# Patient Record
Sex: Female | Born: 1990 | Race: Black or African American | Hispanic: No | Marital: Single | Smoking: Never smoker
Health system: Southern US, Community
[De-identification: ages and names within clinical notes are randomized; demographics above are authoritative.]

---
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
## 2009-08-20 ENCOUNTER — Emergency Department (HOSPITAL_COMMUNITY): Admission: EM | Admit: 2009-08-20 | Discharge: 2009-08-21 | Payer: Self-pay | Admitting: Emergency Medicine

## 2009-08-24 ENCOUNTER — Emergency Department (HOSPITAL_COMMUNITY): Admission: EM | Admit: 2009-08-24 | Discharge: 2009-08-24 | Payer: Self-pay | Admitting: Emergency Medicine

## 2010-01-15 ENCOUNTER — Emergency Department (HOSPITAL_COMMUNITY): Admission: EM | Admit: 2010-01-15 | Discharge: 2010-01-15 | Payer: Self-pay | Admitting: Emergency Medicine
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
## 2014-10-26 ENCOUNTER — Emergency Department (HOSPITAL_COMMUNITY)
Admission: EM | Admit: 2014-10-26 | Discharge: 2014-10-26 | Disposition: A | Discharge status: Home / Self Care | Payer: Self-pay | Attending: Emergency Medicine | Admitting: Emergency Medicine

## 2014-10-26 ENCOUNTER — Encounter (HOSPITAL_COMMUNITY): Payer: Self-pay | Admitting: *Deleted

## 2014-10-26 DIAGNOSIS — K029 Dental caries, unspecified: Secondary | ICD-10-CM | POA: Insufficient documentation

## 2014-10-26 DIAGNOSIS — Z79899 Other long term (current) drug therapy: Secondary | ICD-10-CM | POA: Insufficient documentation

## 2014-10-26 DIAGNOSIS — K0889 Other specified disorders of teeth and supporting structures: Secondary | ICD-10-CM

## 2014-10-26 DIAGNOSIS — K088 Other specified disorders of teeth and supporting structures: Secondary | ICD-10-CM | POA: Insufficient documentation

## 2014-10-26 MED ORDER — CLINDAMYCIN HCL 150 MG PO CAPS
300.0000 mg | ORAL_CAPSULE | Freq: Once | ORAL | Status: AC
  Administered 2014-10-26: 300 mg via ORAL
  Filled 2014-10-26: qty 2

## 2014-10-26 MED ORDER — NAPROXEN 500 MG PO TABS: 500.0000 mg | ORAL_TABLET | Freq: Two times a day (BID) | ORAL | Status: AC

## 2014-10-26 MED ORDER — CLINDAMYCIN HCL 150 MG PO CAPS: 150.0000 mg | ORAL_CAPSULE | Freq: Four times a day (QID) | ORAL | Status: AC

## 2014-10-26 NOTE — Discharge Instructions (Signed)
Dental Pain °Toothache is pain in or around a tooth. It may get worse with chewing or with cold or heat.  °HOME CARE °· Your dentist may use a numbing medicine during treatment. If so, you may need to avoid eating until the medicine wears off. Ask your dentist about this. °· Only take medicine as told by your dentist or doctor. °· Avoid chewing food near the painful tooth until after all treatment is done. Ask your dentist about this. °GET HELP RIGHT AWAY IF:  °· The problem gets worse or new problems appear. °· You have a fever. °· There is redness and puffiness (swelling) of the face, jaw, or neck. °· You cannot open your mouth. °· There is pain in the jaw. °· There is very bad pain that is not helped by medicine. °MAKE SURE YOU:  °· Understand these instructions. °· Will watch your condition. °· Will get help right away if you are not doing well or get worse. °Document Released: 12/16/2007 Document Revised: 09/21/2011 Document Reviewed: 12/16/2007 °ExitCare® Patient Information ©2015 [employer]. This information is not intended to replace advice given to you by your health care provider. Make sure you discuss any questions you have with your health care provider. ° ° ° °Emergency Department Resource Guide °1) Find a Doctor and Pay Out of Pocket °Although you won't have to find out who is covered by your insurance plan, it is a good idea to ask around and get recommendations. You will then need to call the office and see if the doctor you have chosen will accept you as a new patient and what types of options they offer for patients who are self-pay. Some doctors offer discounts or will set up payment plans for their patients who do not have insurance, but you will need to ask so you aren't surprised when you get to your appointment. ° °2) Contact Your Local Health Department °Not all health departments have doctors that can see patients for sick visits, but many do, so it is worth a call to see if yours does.  If you don't know where your local health department is, you can check in your phone book. The CDC also has a tool to help you locate your state's health department, and many state websites also have listings of all of their local health departments. ° °3) Find a Walk-in Clinic °If your illness is not likely to be very severe or complicated, you may want to try a walk in clinic. These are popping up all over the country in pharmacies, drugstores, and shopping centers. They're usually staffed by nurse practitioners or physician assistants that have been trained to treat common illnesses and complaints. They're usually fairly quick and inexpensive. However, if you have serious medical issues or chronic medical problems, these are probably not your best option. ° °No Primary Care Doctor: °- Call [facility] at  832-8000 - they can help you locate a primary care doctor that  accepts your insurance, provides certain services, etc. °- Physician Referral Service- 1-800-533-3463 ° °Chronic Pain Problems: °Organization         Address  Phone   Notes  °[facility]  (336) 297-2271 Patients need to be referred by their primary care doctor.  ° °Medication Assistance: °Organization         Address  Phone   Notes  °[place] [facility] [address] °[place], [place] [address] (336) 641-8030 --Must be a resident   of [place] °-- Must have NO insurance coverage whatsoever (no Medicaid/ Medicare, etc.) °-- The pt. MUST have a primary care doctor that directs their care regularly and follows them in the community °  °[facility]  (866) 331-1348   °[employer]  (888) 892-1162   ° °Agencies that provide inexpensive medical care: °Organization         Address  Phone   Notes  °[facility]  (336) 832-8035   °[facility]    (336) 832-7272   °[facility] [address] °[place], [place] [address] (336) 832-4777   °[facility] [address], °[place] (336) 271-4999   °[facility]    (336) 373-0678   °[facility]    (336) 272-1050   °[facility] ° [address], [place] Phone:  (336) 832-4444, Fax:  (336) 832-4440 Hours of Operation:  9 am - 6 pm, M-F.  Also accepts Medicaid/Medicare and self-pay.  °[facility] ° [address], [place] Phone: (336) 832-3150, Fax: (336) 832-3151. Hours of Operation:  8:30 am - 5:30 pm, M-F.  Also accepts Medicaid and self-pay.  °[facility] [place] [address], [place] Phone: (336) 878-6027   °[facility] [address], [place], [place] (336)723-1848, Ext. 123 Mondays & Thursdays: 7-9 AM.  First 15 patients are seen on a first come, first serve basis. °  ° °Medicaid-accepting [place] Providers: ° °Organization         Address  Phone   Notes  °[facility] [address], [place] (336) 641-2100 Also accepts self-pay patients.  °[facility] [address], [place] ° (336) 856-9996   °[facility] [address], [place] (336) 288-8857   °[facility] [address] [place] [address], [place] (336) 299-7000   °Veita Bland [address], [place]  ° (336) 373-1557 Only accepts [place] Access Medicaid patients after they have their name applied to their card.  ° °Self-Pay (no insurance) in [place]: ° °Organization         Address  Phone   Notes  °Sickle Cell Patients, [facility] [address], [place] (336) 832-1970   °[facility] [address], [place] (336) 832-4400   °[facility] ° [address], [place] (336) 992-4800   °[facility]/Dr. Osei-Bonsu ° [address] [place] [address], [place] or [address], [place] (336) 841-8500  Phone number for both [place] and [place] locations is the same.  °[facility] [address], [place] (336) 299-0000   °[facility] [address] [place] [address], [place] or [address] (336) 852-7530 °(336) 878-2260   °[facility] [address],  (336) 350-1642, phone; (336) 294-5005, fax Sees patients 1st and 3rd Saturday of every month.  Must not qualify for public or private insurance (i.e. Medicaid, Medicare,  Health Choice, Veterans' Benefits) • Household income should be no more than 200% of the poverty level •The clinic cannot treat you if you are pregnant or think you are pregnant • Sexually transmitted diseases are not treated at the clinic.  ° ° °Dental Care: °Organization         Address    Phone  Notes  °[facility] [address], [place] (336) 641-6152 Accepts children up to age 21 who are enrolled in Medicaid or [place] Health Choice; pregnant women with a Medicaid card; and children who have applied for Medicaid or [place] Health Choice, but were declined, whose parents can pay a reduced fee at time of service.  °[facility] [place]  [address], [place] (336) 641-7733 Accepts children up to age 21 who are enrolled in Medicaid or [place] Health Choice; pregnant women with a Medicaid card; and children who have applied for Medicaid or [place] Health Choice, but were declined, whose parents can pay a reduced fee at time of service.  °[facility] ° [address], [place] (336) 641-4533 Patients are seen by appointment only. Walk-ins are not accepted. [facility] will see patients 18 years of age and older. °Monday - Tuesday (8am-5pm) °Most Wednesdays (8:30-5pm) °$30 per visit, cash only  °[facility] ° [address], [place] (336) 641-4533 Patients are seen by appointment  only. Walk-ins are not accepted. [facility] will see patients 18 years of age and older. °One Wednesday Evening (Monthly: Volunteer Based).  $30 per visit, cash only  °[facility]  (919) 537-3737 for adults; Children under age 4, call Graduate Pediatric Dentistry at ([phone]) [phone]. Children aged 4-14, please call (919) [phone] to request a pediatric application. ° Dental services are provided in all areas of dental care including fillings, crowns and bridges, complete and partial dentures, implants, gum treatment, root canals, and extractions. Preventive care is also provided. Treatment is provided to both adults and children. °Patients are selected via a lottery and there is often a waiting list. °  °[facility] [address], °[place] ° (336) 763-8833 [email or web address] °  °[facility] [address], [place], [place] (336)723-1848, Ext. 123 Second and Fourth Thursday of each month, opens at 6:30 AM; Clinic ends at 9 AM.  Patients are seen on a first-come first-served basis, and a limited number are seen during each clinic.  ° °[facility] ° [address], [place], [place] (336) 723-7904   Eligibility Requirements °You must have lived in [place], [place], or [place] counties for at least the last three months. °  You cannot be eligible for state or federal sponsored healthcare insurance, including Veterans Administration, Medicaid, or Medicare. °  You generally cannot be eligible for healthcare insurance through your employer.  °  How to apply: °Eligibility screenings are held every Tuesday and Wednesday afternoon from 1:00 pm until 4:00 pm. You do not need an appointment for the interview!  °[facility] [address], [place], [place] 336-631-2330   °[facility]  336-342-8273   °[facility]  336-703-3100   °[facility]  336-570-6415   ° °Behavioral Health  Resources in the Community: °Intensive Outpatient Programs °Organization         Address  Phone  Notes  °[facility] [address], [place], [place] 336-878-6098   °[facility] [address], [place], [place] 336-832-9800   °[facility] [address], [place],  ° 336-882-2125   °[facility] [address],  °[place],  1-800-853-5163 or 336-641-4981   °Substance Abuse Resources °Organization           Address  Phone  Notes  °[facility]  336-882-2125   °[facility]  336-784-9470   °[facility]  336-285-9073   °[facility]  336-845-3988   °Residential & Outpatient Substance Abuse Program  1-800-659-3381   °Psychological Services °Organization         Address  Phone  Notes  °[facility]  336- 832-9600   °[facility]  336- 378-7881   °[facility] [address], [place] 1-800-853-5163 or 336-641-4981   ° °Mobile Crisis Teams °Organization         Address  Phone  Notes  °[facility], Mobile Crisis Care Unit  1-877-626-1772   °Assertive °[facility] ° [address] [place], [place] 336-834-9664   °Sharon DeEsch [address] °[place] 336-554-5454   ° °Self-Help/Support Groups °Organization         Address  Phone             Notes  °[facility] [place] - variety of support groups  336- 373-1402 Call for more information  °Narcotics Anonymous (NA), [facility] [address], °[place]  2 meetings at this location  ° °Residential Treatment Programs °Organization         Address  Phone  Notes  °[facility] [address],    °[place]  1-866-801-8205   °[facility] ° [address], [place], [place] 704-293-8524   °[facility] [address], [place] 336-845-3988 Admissions: 8am-3pm M-F  °[facility] [address]    °[place], [place] 336-841-1104   °[facility] [address], [place], [place] 336-379-7146   °[facility] [address]  °[place], [place] 336-285-9073   °[facility] Intensive Outpatient [address], [place], [place] 336-852-3033   °[facility] [address]  °[place], [place] 1-877-615-2722 or 336-784-9470   °[facility] [address] [place], [place] 336-227-7417 Accepts Medicaid  °[facility] [address]  ° [place] 1-800-659-3381 Substance Abuse/Addiction Treatment  ° °[place] Behavioral Health Resources °Organization         Address  Phone  Notes  °[facility]  (888) 581-9988   °Julie Brannon, PhD [address] [place], [place]   (336) 349-5553 or (336) 951-0000   °[facility]   [address] °[place], [place] (336) 349-4454   °[facility] [address], [place], [place] (336) 342-8316 Insurance/Medicaid/sponsorship through [facility]  °[facility] [address]                                    [place], [place] (336) 342-8316 Therapy/tele-psych/case  °[facility] [address]  ° [place], [place] (336) 349-2233    °Dr. Arfeen  (336) 349-4544   °[facility] [place]  [employer] [place] [facility] 1) [address],  °2) [address], [place] °3)  [address], [place] (336) 349-3220 °(336) 342-7768 ° °(336) 342-8140   °[place] [facility] (336) 342-1394 or (336) 342-3537 (After Hours)    ° °  °

## 2014-10-26 NOTE — ED Provider Notes (Signed)
CSN: [id number]     Arrival date & time 10/26/14  1216 History   First MD Initiated Contact with Patient 10/26/14 1301     Chief Complaint  Patient presents with  . Dental Problem     (Consider location/radiation/quality/duration/timing/severity/associated sxs/prior Treatment) HPI   Joann Osborn is a 24 y.o. female who presents to the Emergency Department complaining of dental pain for 3 days.  She reports sharp pain from her tooth that radiates up her face to her right ear.  Pain is worse with chewing and cold foods or liquids.  She states she feels like her face is swelling. She has tried OTC analgesics without relief.  She denies fever, difficulty swallowing , neck pain.  She states she has not tried to contact a Education officer, community.    History reviewed. No pertinent past medical history. History reviewed. No pertinent past surgical history. History reviewed. No pertinent family history. History  Substance Use Topics  . Smoking status: Never Smoker   . Smokeless tobacco: Not on file  . Alcohol Use: No   OB History    No data available     Review of Systems  Constitutional: Negative for fever and appetite change.  HENT: Positive for dental problem. Negative for congestion, facial swelling, sore throat and trouble swallowing.   Eyes: Negative for pain and visual disturbance.  Musculoskeletal: Negative for neck pain and neck stiffness.  Neurological: Negative for dizziness, facial asymmetry and headaches.  Hematological: Negative for adenopathy.  All other systems reviewed and are negative.     Allergies  Review of patient's allergies indicates no known allergies.  Home Medications   Prior to Admission medications   Medication Sig Start Date End Date Taking? Authorizing Provider  acetaminophen (TYLENOL) 500 MG tablet Take 500 mg by mouth every 6 (six) hours as needed.   Yes Historical Provider, MD  benzocaine (HURRICAINE) 20 % GEL Use as directed 1 application in the mouth or  throat 4 (four) times daily as needed.   Yes Historical Provider, MD  ibuprofen (ADVIL,MOTRIN) 200 MG tablet Take 200 mg by mouth every 6 (six) hours as needed.   Yes Historical Provider, MD   BP 105/73 mmHg  Pulse 59  Temp(Src) 99.1 F (37.3 C) (Oral)  Resp 18  Ht  (1.702 m)  Wt 203 lb (92.08 kg)  BMI 31.79 kg/m2  SpO2 100%  LMP 10/03/2014 Physical Exam  Constitutional: She is oriented to person, place, and time. She appears well-developed and well-nourished. No distress.  HENT:  Head: Normocephalic and atraumatic.  Right Ear: Tympanic membrane and ear canal normal.  Left Ear: Tympanic membrane and ear canal normal.  Mouth/Throat: Uvula is midline, oropharynx is clear and moist and mucous membranes are normal. No trismus in the jaw. Dental caries present. No dental abscesses or uvula swelling.    Tenderness to palpation and dental caries of the right lower second molar. No facial swelling, obvious dental abscess, trismus, or sublingual abnml.    Neck: Normal range of motion. Neck supple.  Cardiovascular: Normal rate, regular rhythm and normal heart sounds.   No murmur heard. Pulmonary/Chest: Effort normal and breath sounds normal.  Musculoskeletal: Normal range of motion.  Lymphadenopathy:    She has no cervical adenopathy.  Neurological: She is alert and oriented to person, place, and time. She exhibits normal muscle tone. Coordination normal.  Skin: Skin is warm and dry.  Nursing note and vitals reviewed.   ED Course  Procedures (including critical care time)  Labs Review Labs Reviewed - No data to display  Imaging Review No results found.   EKG Interpretation None      MDM   Final diagnoses:  Pain, dental   Pt is well appearing, non-toxic.  Airway is patent.  No concerning sx's for infection to the deep structures of the neck or floor of the mouth.  Rx for cleocin and naprosyn, given dental referral info.       Pauline Ausammy Kameisha Malicki, PA-C 10/26/14  1436  Bethann BerkshireJoseph Zammit, MD 10/26/14 [phone]

## 2014-10-26 NOTE — ED Notes (Signed)
Pt states she has had dental pain and swelling from back right lower wisdom tooth, half of tooth has broken off.
